# Patient Record
Sex: Female | Born: 1995 | Race: Black or African American | Hispanic: No | Marital: Single | Smoking: Former smoker
Health system: Southern US, Community
[De-identification: ages and names within clinical notes are randomized; demographics above are authoritative.]

## PROBLEM LIST (undated history)

## (undated) DIAGNOSIS — D689 Coagulation defect, unspecified: Secondary | ICD-10-CM

## (undated) HISTORY — PX: DILATION AND CURETTAGE OF UTERUS: SHX78

## (undated) HISTORY — DX: Coagulation defect, unspecified: D68.9

---
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
## 2020-06-04 HISTORY — PX: OTHER SURGICAL HISTORY: SHX169

## 2021-11-21 ENCOUNTER — Ambulatory Visit (INDEPENDENT_AMBULATORY_CARE_PROVIDER_SITE_OTHER): Payer: Medicaid Other | Admitting: Family Medicine

## 2021-11-21 ENCOUNTER — Encounter: Payer: Self-pay | Admitting: Family Medicine

## 2021-11-21 VITALS — BP 124/66 | HR 72 | Temp 98.2°F | Ht 67.0 in | Wt 243.2 lb

## 2021-11-21 DIAGNOSIS — D682 Hereditary deficiency of other clotting factors: Secondary | ICD-10-CM

## 2021-11-21 DIAGNOSIS — D6861 Antiphospholipid syndrome: Secondary | ICD-10-CM | POA: Diagnosis not present

## 2021-11-21 DIAGNOSIS — Z0000 Encounter for general adult medical examination without abnormal findings: Secondary | ICD-10-CM | POA: Diagnosis not present

## 2021-11-21 NOTE — Progress Notes (Signed)
Phone [phone]   Subjective:   Patient is a 26 y.o. female presenting for annual physical.    Chief Complaint  Patient presents with   Establish Care    Need new pcp, moved from [place] Fasting 4 months pregnancy, has clotting disorder   New pt-annual.  Moved from [place]. Dad here. Pregnant 4 mo-Factor V and APLS.  [facility]ing ob tomorrow[facility].  Will need Lovenox.  No energy-had iron infusions w/last pregnancy.  Last seen 8/17.  Vomiting if eats.  Dau 24yo.    See problem oriented charting- ROS- ROS: Gen: no fever, chills  Skin: no rash, itching ENT: no ear pain, ear drainage, nasal congestion, rhinorrhea, sinus pressure, sore throat Eyes: no blurry vision, double vision Resp: no cough, wheeze,SOB CV: no CP, palpitations, LE edema,  GI:+vomiting.  Has seen bright red blood at times-about twice/wk.   GU: no dysuria, urgency, frequency, hematuria MSK: no joint pain, myalgias, back pain Neuro: + Gets mild HA freq.  Not severe.   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   Past Surgical History:  Procedure Laterality Date   arm surgery Left 06/2020   fx   DILATION AND CURETTAGE OF UTERUS      Family History  Problem Relation Age of Onset   Hyperlipidemia Father    Hypertension Father    Diabetes Father    Hypertension Maternal Grandmother    Hyperlipidemia Maternal Grandmother    Diabetes Maternal Grandmother    Cancer Maternal Grandmother    Kidney disease Paternal Grandmother    Hypertension Paternal Grandmother    Hyperlipidemia Paternal Grandmother    Cancer Paternal Grandmother     Medications- reviewed and updated Current Outpatient Medications  Medication Sig Dispense Refill   Prenatal Vit-Fe Fumarate-FA  (PRENATAL VITAMIN PO) Take 1 tablet by mouth daily.     No current facility-administered medications for this visit.    Allergies-reviewed and updated No Known Allergies  Social History   Social History Narrative   Not on file   Objective  Objective:  BP 124/66   Pulse 72   Temp 98.2 F (36.8 C) (Temporal)   Ht 5\' 7"  (1.702 m)   Wt 243 lb 4 oz (110.3 kg)   SpO2 99%   BMI 38.10 kg/m  Physical Exam  Gen: WDWN NAD HEENT: NCAT, conjunctiva not injected, sclera nonicteric TM WNL B, OP moist, no exudates  NECK:  supple, no thyromegaly, no nodes, no carotid bruits CARDIAC: RRR, S1S2+, no murmur. DP 2+B LUNGS: CTAB. No wheezes ABDOMEN:  BS+, soft, NTND, No HSM, no masses EXT:  no edema MSK: no gross abnormalities. MS 5/5 all 4 NEURO: A&O x3.  CN II-XII intact.  PSYCH: normal mood. Good eye contact      Assessment and Plan   Health Maintenance counseling: 1. Anticipatory guidance: Patient counseled regarding regular dental exams q6 months, eye exams,  avoiding smoking and second hand smoke, limiting alcohol to 1 beverage per day, no illicit drugs.   2. Risk factor reduction:  Advised patient of need for regular exercise and diet rich and fruits and vegetables to reduce risk of heart attack and stroke. Exercise- some.  Wt Readings from Last 3 Encounters:  11/21/21 243  lb 4 oz (110.3 kg)   3. Immunizations/screenings/ancillary studies Immunization History  Administered Date(s) Administered   HPV 9-valent 10/22/2009, 08/10/2010   Tdap 10/22/2009   Health Maintenance Due  Topic Date Due   HIV Screening  Never done   Hepatitis C Screening  Never done   PAP-Cervical Cytology Screening  Never done   PAP SMEAR-Modifier  Never done   TETANUS/TDAP  10/23/2019    4. Cervical cancer screening: 10/20/21 5. Skin cancer screening- advised regular sunscreen use. Denies worrisome, changing, or new skin lesions.  6. Birth control/STD check: pregnant 7. Smoking associated  screening: non smoker 8. Alcohol screening: none  Problem List Items Addressed This Visit       Hematopoietic and Hemostatic   Factor V deficiency ([place])   Other Visit Diagnoses     Wellness examination    -  Primary   Antiphospholipid syndrome ([place])          Wellness-anticipatory guidance.  Work on diet/exercise.  No labs-defer to OB.  F/u 1 yr  High risk pregnancy d/t clotting disorders-seeing ob tomorrow.    Recommended follow up: 50mReturn in about 1 year (around 11/22/2022) for annual. Future Appointments  Date Time Provider [place]  12/05/2022  8:00 AM Tawnya Crook, MD [facility]    Lab/Order associations:+ fasting   ICD-10-CM   1. Wellness examination  Z00.00     2. Factor V deficiency ([place])  D68.2     3. Antiphospholipid syndrome (HCC)  D68.61       No orders of the defined types were placed in this encounter.    Wellington [place], MD

## 2021-11-21 NOTE — Patient Instructions (Signed)
Welcome to [facility] at [place]! It was a pleasure meeting you today. ° °As discussed, Please schedule a 12 month follow up visit today. ° °PLEASE NOTE: ° °If you had any LAB tests please let us know if you have not heard back within a few days. You may see your results on MyChart before we have a chance to review them but we will give you a call once they are reviewed by us. If we ordered any REFERRALS today, please let us know if you have not heard from their office within the next week.  °Let us know through MyChart if you are needing REFILLS, or have your pharmacy send us the request. You can also use MyChart to communicate with me or any office staff. ° °Please try these tips to maintain a healthy lifestyle: ° °Eat most of your calories during the day when you are active. Eliminate processed foods including packaged sweets (pies, cakes, cookies), reduce intake of potatoes, white bread, white pasta, and white rice. Look for whole grain options, oat flour or almond flour. ° °Each meal should contain half fruits/vegetables, one quarter protein, and one quarter carbs (no bigger than a computer mouse). ° °Cut down on sweet beverages. This includes juice, soda, and sweet tea. Also watch fruit intake, though this is a healthier sweet option, it still contains natural sugar! Limit to 3 servings daily. ° °Drink at least 1 glass of water with each meal and aim for at least 8 glasses per day ° °Exercise at least 150 minutes every week.   °

## 2021-11-29 ENCOUNTER — Other Ambulatory Visit: Payer: Self-pay

## 2021-11-29 ENCOUNTER — Other Ambulatory Visit: Payer: Self-pay | Admitting: Obstetrics and Gynecology

## 2021-11-29 DIAGNOSIS — Z363 Encounter for antenatal screening for malformations: Secondary | ICD-10-CM

## 2021-12-23 ENCOUNTER — Encounter: Payer: Self-pay | Admitting: *Deleted

## 2021-12-27 ENCOUNTER — Other Ambulatory Visit: Payer: Self-pay

## 2021-12-27 ENCOUNTER — Ambulatory Visit: Payer: Medicaid Other

## 2021-12-27 ENCOUNTER — Ambulatory Visit (HOSPITAL_BASED_OUTPATIENT_CLINIC_OR_DEPARTMENT_OTHER): Payer: Medicaid Other | Admitting: Obstetrics and Gynecology

## 2021-12-27 ENCOUNTER — Ambulatory Visit: Payer: Medicaid Other | Attending: Obstetrics and Gynecology

## 2021-12-27 VITALS — BP 121/70 | HR 91

## 2021-12-27 DIAGNOSIS — D6851 Activated protein C resistance: Secondary | ICD-10-CM

## 2021-12-27 DIAGNOSIS — O99119 Other diseases of the blood and blood-forming organs and certain disorders involving the immune mechanism complicating pregnancy, unspecified trimester: Secondary | ICD-10-CM | POA: Insufficient documentation

## 2021-12-27 DIAGNOSIS — O99112 Other diseases of the blood and blood-forming organs and certain disorders involving the immune mechanism complicating pregnancy, second trimester: Secondary | ICD-10-CM

## 2021-12-27 DIAGNOSIS — D6861 Antiphospholipid syndrome: Secondary | ICD-10-CM

## 2021-12-27 DIAGNOSIS — O99212 Obesity complicating pregnancy, second trimester: Secondary | ICD-10-CM

## 2021-12-27 DIAGNOSIS — E668 Other obesity: Secondary | ICD-10-CM | POA: Diagnosis present

## 2021-12-27 DIAGNOSIS — O2622 Pregnancy care for patient with recurrent pregnancy loss, second trimester: Secondary | ICD-10-CM

## 2021-12-27 DIAGNOSIS — Z3A19 19 weeks gestation of pregnancy: Secondary | ICD-10-CM | POA: Diagnosis present

## 2021-12-27 DIAGNOSIS — E669 Obesity, unspecified: Secondary | ICD-10-CM

## 2021-12-27 DIAGNOSIS — Z363 Encounter for antenatal screening for malformations: Secondary | ICD-10-CM | POA: Diagnosis present

## 2021-12-27 DIAGNOSIS — Z362 Encounter for other antenatal screening follow-up: Secondary | ICD-10-CM

## 2021-12-27 NOTE — Progress Notes (Signed)
Maternal-Fetal Medicine   Name: Isabel Green DOB: 13-Oct-1995 MRN: [id number] Referring Provider: Frann Rider, MD  I had the pleasure of seeing Ms. Hillock [facility]. She is G6 P1041 at 19w 6d gestation and is here for fetal anatomy scan and consultation. Past medical history significant for antiphospholipid syndrome diagnosed in her previous pregnancy.  Patient recently moved from [place] where she initiated her prenatal care.  Obstetrical history is significant for 4 early spontaneous miscarriages between 2017 and 2021.  She had D&C procedure performed after 1 miscarriage, but no fetal karyotype analysis was performed.  She had a term vaginal delivery in August 2022 of a female infant weighing 5 pounds and 9 ounces at birth.  In that pregnancy, antiphospholipid antibodies were detected, and patient was taking low molecular weight prophylaxis with successful pregnancy outcome.  No history of preeclampsia or diagnosed of fetal growth restriction in her previous pregnancy She had induction of labor at [redacted] weeks gestation.   Patient is unsure whether antiphospholipid antibodies were repeated to confirm the diagnosis.  She also has factor V Leiden heterozygous mutation. She does not give personal history of thromboembolism. She does not have hypertension or diabetes or any other chronic medical conditions.  She reports she does not have sickle cell trait.  Past surgical history: Nil of note. Medications: Prenatal vitamins, Lovenox 40 mg daily.  Patient was advised to take low-dose aspirin but has not yet started taking aspirin. Allergies: No known drug allergies. Social history: Denies tobacco or drug or alcohol use.  Her partner is African-American and he is in good health. Family history: No history of venous thromboembolism in the family. GYN history: No history of abnormal Pap smears or cervical surgeries.  No history of breast  biopsies.  Ultrasound We performed a fetal anatomical survey.  Fetal biometry is consistent with the previously established dates.  Amniotic fluid is normal and good fetal activity seen.  No markers of aneuploidies or fetal structural defects are seen.  Fetal anatomical survey could not be completed because of fetal position. Patient understands the limitations of ultrasound in detecting fetal anomalies.  Our concerns include Antiphospholipid syndrome This diagnosis was made in her previous pregnancy following detection of antiphospholipid (aPL) antibodies.  The test was ordered because of her history of recurrent miscarriages.  We do not have copies of the results with [place] now.  Patient has been taking Lovenox prophylaxis in this pregnancy.  She does not have personal history of venous thromboembolism or any other pregnancy complications including preeclampsia or indicated preterm delivery.  APS is an autoimmune disorder and the diagnosis should meet the clinical and laboratory criteria.  History of 3 or more spontaneous miscarriages meets the clinical criteria.  APL antibody should be positive on at least 2 occasions 12 weeks apart.  APS is associated with venous or arterial thrombosis.  Adverse pregnancy outcomes include preeclampsia, preterm delivery, stillbirth, and fetal growth restriction.  However, the association with adverse outcomes is not consistent.  Current recommendation is to treat APS with prophylactic heparin and low-dose aspirin.  Treatment does not always reduce the likelihood of poor pregnancy outcomes.  Because of a successful previous pregnancy outcome, patient would like to continue low molecular weight heparin prophylaxis.  Factor V Leiden heterozygous mutation increases the likelihood of venous thromboembolism in pregnancy but the absolute risk is very small.  Pregnancy increases the risk of venous thromboembolism.  In the absence of antiphospholipid syndrome, factor V  Leiden heterozygous  mutation is not a strong indication for heparin prophylaxis. I counseled the patient of low-dose aspirin prophylaxis to prevent or reduce the likelihood of developing gestational hypertension/preeclampsia.  Patient agreed to start taking low-dose aspirin.  I counseled the patient on possible side effects of heparin including rashes, thrombocytopenia and osteoporosis (rare).  Recommendations -Aspirin 81 mg daily from now till delivery. -Continue low molecular weight heparin prophylaxis till 36 or [redacted] weeks gestation and then switched to unfractionated heparin 10,000 units twice daily. -Calcium supplements. -An appointment was made for her to return in 5 weeks for completion of fetal anatomy. -Serial fetal growth assessments till delivery. -Weekly BPP from [redacted] weeks gestation till delivery. -Recommend delivery at [redacted] weeks gestation provided antenatal testing remains reassuring. -Postpartum anticoagulation till 6 weeks after delivery.   Thank you for consultation.  If you have any questions or concerns, please contact me the [facility].  Consultation including face-to-face (more than 50%) counseling 45 minutes.

## 2022-01-31 ENCOUNTER — Other Ambulatory Visit: Payer: Self-pay | Admitting: *Deleted

## 2022-01-31 ENCOUNTER — Ambulatory Visit: Payer: Self-pay

## 2022-01-31 ENCOUNTER — Ambulatory Visit: Payer: Medicaid Other

## 2022-01-31 ENCOUNTER — Ambulatory Visit: Payer: Medicaid Other | Attending: Obstetrics and Gynecology

## 2022-01-31 ENCOUNTER — Other Ambulatory Visit: Payer: Self-pay

## 2022-01-31 VITALS — BP 122/72 | HR 86

## 2022-01-31 DIAGNOSIS — Z3A24 24 weeks gestation of pregnancy: Secondary | ICD-10-CM | POA: Diagnosis not present

## 2022-01-31 DIAGNOSIS — Z362 Encounter for other antenatal screening follow-up: Secondary | ICD-10-CM

## 2022-01-31 DIAGNOSIS — D6851 Activated protein C resistance: Secondary | ICD-10-CM

## 2022-01-31 DIAGNOSIS — O99112 Other diseases of the blood and blood-forming organs and certain disorders involving the immune mechanism complicating pregnancy, second trimester: Secondary | ICD-10-CM | POA: Diagnosis not present

## 2022-01-31 DIAGNOSIS — O99119 Other diseases of the blood and blood-forming organs and certain disorders involving the immune mechanism complicating pregnancy, unspecified trimester: Secondary | ICD-10-CM

## 2022-01-31 DIAGNOSIS — E669 Obesity, unspecified: Secondary | ICD-10-CM

## 2022-01-31 DIAGNOSIS — D6861 Antiphospholipid syndrome: Secondary | ICD-10-CM | POA: Diagnosis not present

## 2022-01-31 DIAGNOSIS — O99212 Obesity complicating pregnancy, second trimester: Secondary | ICD-10-CM | POA: Diagnosis not present

## 2022-01-31 DIAGNOSIS — E668 Other obesity: Secondary | ICD-10-CM

## 2022-02-15 ENCOUNTER — Other Ambulatory Visit: Payer: Self-pay

## 2022-03-02 ENCOUNTER — Ambulatory Visit: Payer: Medicaid Other | Admitting: *Deleted

## 2022-03-02 ENCOUNTER — Ambulatory Visit: Payer: Medicaid Other | Attending: Maternal & Fetal Medicine

## 2022-03-02 VITALS — BP 127/69 | HR 84

## 2022-03-02 DIAGNOSIS — O99213 Obesity complicating pregnancy, third trimester: Secondary | ICD-10-CM | POA: Diagnosis present

## 2022-03-02 DIAGNOSIS — D6861 Antiphospholipid syndrome: Secondary | ICD-10-CM | POA: Diagnosis present

## 2022-03-02 DIAGNOSIS — Z3A29 29 weeks gestation of pregnancy: Secondary | ICD-10-CM | POA: Insufficient documentation

## 2022-03-02 DIAGNOSIS — D6851 Activated protein C resistance: Secondary | ICD-10-CM | POA: Diagnosis present

## 2022-03-02 DIAGNOSIS — O99119 Other diseases of the blood and blood-forming organs and certain disorders involving the immune mechanism complicating pregnancy, unspecified trimester: Secondary | ICD-10-CM | POA: Diagnosis present

## 2022-03-02 DIAGNOSIS — Z362 Encounter for other antenatal screening follow-up: Secondary | ICD-10-CM | POA: Insufficient documentation

## 2022-03-02 DIAGNOSIS — E668 Other obesity: Secondary | ICD-10-CM | POA: Insufficient documentation

## 2022-03-02 DIAGNOSIS — O99212 Obesity complicating pregnancy, second trimester: Secondary | ICD-10-CM | POA: Insufficient documentation

## 2022-03-02 DIAGNOSIS — O99113 Other diseases of the blood and blood-forming organs and certain disorders involving the immune mechanism complicating pregnancy, third trimester: Secondary | ICD-10-CM

## 2022-03-02 DIAGNOSIS — E669 Obesity, unspecified: Secondary | ICD-10-CM

## 2022-03-19 ENCOUNTER — Other Ambulatory Visit: Payer: Self-pay

## 2022-03-19 ENCOUNTER — Inpatient Hospital Stay (HOSPITAL_COMMUNITY)
Admission: AD | Admit: 2022-03-19 | Discharge: 2022-03-19 | Disposition: A | Discharge status: Home / Self Care | Payer: Medicaid Other | Attending: Obstetrics and Gynecology | Admitting: Obstetrics and Gynecology

## 2022-03-19 ENCOUNTER — Encounter (HOSPITAL_COMMUNITY): Payer: Self-pay | Admitting: Obstetrics and Gynecology

## 2022-03-19 DIAGNOSIS — Z3A31 31 weeks gestation of pregnancy: Secondary | ICD-10-CM | POA: Insufficient documentation

## 2022-03-19 DIAGNOSIS — O26893 Other specified pregnancy related conditions, third trimester: Secondary | ICD-10-CM | POA: Insufficient documentation

## 2022-03-19 DIAGNOSIS — O4703 False labor before 37 completed weeks of gestation, third trimester: Secondary | ICD-10-CM

## 2022-03-19 DIAGNOSIS — O4700 False labor before 37 completed weeks of gestation, unspecified trimester: Secondary | ICD-10-CM

## 2022-03-19 DIAGNOSIS — M549 Dorsalgia, unspecified: Secondary | ICD-10-CM | POA: Diagnosis not present

## 2022-03-19 DIAGNOSIS — O99891 Other specified diseases and conditions complicating pregnancy: Secondary | ICD-10-CM

## 2022-03-19 LAB — URINALYSIS, ROUTINE W REFLEX MICROSCOPIC
Bilirubin Urine: NEGATIVE
Glucose, UA: NEGATIVE mg/dL
Hgb urine dipstick: NEGATIVE
Ketones, ur: NEGATIVE mg/dL
Nitrite: NEGATIVE
Protein, ur: NEGATIVE mg/dL
Specific Gravity, Urine: 1.012 (ref 1.005–1.030)
pH: 7 (ref 5.0–8.0)

## 2022-03-19 MED ORDER — NIFEDIPINE 10 MG PO CAPS
10.0000 mg | ORAL_CAPSULE | ORAL | Status: DC | PRN
  Administered 2022-03-19 (×2): 10 mg via ORAL
  Filled 2022-03-19 (×2): qty 1

## 2022-03-19 MED ORDER — NIFEDIPINE 10 MG PO CAPS: 10.0000 mg | ORAL_CAPSULE | Freq: Four times a day (QID) | ORAL | 0 refills | Status: AC | PRN

## 2022-03-19 NOTE — MAU Note (Signed)
.  Isabel Green is a 27 y.o. at [redacted]w[redacted]d here in MAU reporting: ongoing lower abd cramping since Monday.  Pt reports she informed ob provider at her last appointment earlier this week, reports cramping has gotten worse this morning.  Denies LOF or vag bleeding.    Pain score: 7/10 Vitals:   03/19/22 1046  Pulse: 85  Resp: 17  Temp: 98 F (36.7 C)  SpO2: 98%     FHT: 145 Lab orders placed from triage:   ua

## 2022-03-19 NOTE — MAU Provider Note (Signed)
Chief Complaint:  Abdominal Pain   Event Date/Time   First Provider Initiated Contact with Patient 03/19/22 1132      HPI: Isabel Green is a 27 y.o. [id number] at [redacted]w[redacted]d who presents to maternity admissions reporting episodes of cramping that last a few hours, usually starting when the patient is standing at work. Last night, 03/18/22, she had more cramping than usual and in continued during the night, waking her up occasionally. She reports at least 2-3 contractions per hour last night.  Today, the contractions are still present but are improved from last night. She reports pelvic pressure, but no urinary symptoms.  She reports good fetal movement.    HPI  Past Medical History: Past Medical History:  Diagnosis Date   Clotting disorder ([place])    factor V and APLS    Past obstetric history: OB History  Gravida Para Term Preterm AB Living  6 1 1  0 4 1  SAB IAB Ectopic Multiple Live Births  4 0 0   1    # Outcome Date GA Lbr Len/2nd Weight Sex Delivery Anes PTL Lv  6 Current           5 Term 10/28/20    F Vag-Spont   LIV  4 SAB           3 SAB           2 SAB           1 SAB             Past Surgical History: Past Surgical History:  Procedure Laterality Date   arm surgery Left 06/2020   fx   DILATION AND CURETTAGE OF UTERUS      Family History: Family History  Problem Relation Age of Onset   Healthy Mother    Hyperlipidemia Father    Hypertension Father    Diabetes Father    Hypertension Maternal Grandmother    Hyperlipidemia Maternal Grandmother    Diabetes Maternal Grandmother    Cancer Maternal Grandmother    Kidney disease Paternal Grandmother    Hypertension Paternal Grandmother    Hyperlipidemia Paternal Grandmother    Cancer Paternal Grandmother    Asthma Neg Hx    Heart disease Neg Hx    Stroke Neg Hx     Social History: Social History   Tobacco Use   Smoking status: Former    Types: Cigars   Smokeless tobacco: Never  Scientific laboratory technician  Use: Never used  Substance Use Topics   Alcohol use: Not Currently    Alcohol/week: 1.0 standard drink of alcohol    Types: 1 Glasses of wine per week    Comment: used in the past   Drug use: Not Currently    Types: Marijuana    Comment: used in the past    Allergies: No Known Allergies  Meds:  Medications Prior to Admission  Medication Sig Dispense Refill Last Dose   aspirin 81 MG chewable tablet Chew 81 mg by mouth daily.   03/18/2022   Prenatal Vit-Fe Fumarate-FA (PRENATAL VITAMIN PO) Take 1 tablet by mouth daily.   03/18/2022   enoxaparin (LOVENOX) 40 MG/0.4ML injection Inject 40 mg into the skin daily.   03/17/2022    ROS:  Review of Systems  Constitutional:  Negative for chills, fatigue and fever.  Eyes:  Negative for visual disturbance.  Respiratory:  Negative for shortness of breath.   Cardiovascular:  Negative for chest pain.  Gastrointestinal:  Positive for abdominal pain. Negative for nausea and vomiting.  Genitourinary:  Negative for difficulty urinating, dysuria, flank pain, pelvic pain, vaginal bleeding, vaginal discharge and vaginal pain.  Musculoskeletal:  Positive for back pain.  Neurological:  Negative for dizziness and headaches.  Psychiatric/Behavioral: Negative.       I have reviewed patient's Past Medical Hx, Surgical Hx, Family Hx, Social Hx, medications and allergies.   Physical Exam  Patient Vitals for the past 24 hrs:  BP Temp Temp src Pulse Resp SpO2 Height Weight  03/19/22 1115 125/65 -- -- 88 -- 98 % -- --  03/19/22 1053 129/79 -- -- -- -- -- -- --  03/19/22 1046 -- 98 F (36.7 C) Oral 85 17 98 % 5\' 7"  (1.702 m) 123.5 kg   Constitutional: Well-developed, well-nourished female in no acute distress.  Cardiovascular: normal rate Respiratory: normal effort GI: Abd soft, non-tender, gravid appropriate for gestational age.  MS: Extremities nontender, no edema, normal ROM Neurologic: Alert and oriented x 4.  GU: Neg CVAT.  PELVIC EXAM:    Dilation: Closed Effacement (%): Thick Exam by:: L. Leftwich-Kirby, CNM  FHT:  Baseline 140, moderate variability, accelerations present, no decelerations Contractions: none on toco or to palpation   Labs: Results for orders placed or performed during the hospital encounter of 03/19/22 (from the past 24 hour(s))  Urinalysis, Routine w reflex microscopic Urine, Clean Catch     Status: Abnormal   Collection Time: 03/19/22 10:51 AM  Result Value Ref Range   Color, Urine YELLOW YELLOW   APPearance HAZY (A) CLEAR   Specific Gravity, Urine 1.012 1.005 - 1.030   pH 7.0 5.0 - 8.0   Glucose, UA NEGATIVE NEGATIVE mg/dL   Hgb urine dipstick NEGATIVE NEGATIVE   Bilirubin Urine NEGATIVE NEGATIVE   Ketones, ur NEGATIVE NEGATIVE mg/dL   Protein, ur NEGATIVE NEGATIVE mg/dL   Nitrite NEGATIVE NEGATIVE   Leukocytes,Ua TRACE (A) NEGATIVE   RBC / HPF 0-5 0 - 5 RBC/hpf   WBC, UA 0-5 0 - 5 WBC/hpf   Bacteria, UA RARE (A) NONE SEEN   Squamous Epithelial / HPF 6-10 0 - 5 /HPF   Mucus PRESENT       Imaging:   MAU Course/MDM: Orders Placed This Encounter  Procedures   Urinalysis, Routine w reflex microscopic Urine, Clean Catch    Meds ordered this encounter  Medications   NIFEdipine (PROCARDIA) capsule 10 mg     NST reviewed and appropriate for gestational age Cervix closed and thick, no evidence of PTL Procardia 10 mg x 2 doses given with resolution of contractions Rx for Procardia 10 mg Q 6 hours PRN at home Reviewed s/sx of PTL, reasons to seek care Pt to wear pregnancy support belt, drink plenty of water, and f/u in office if she needs to change work hours or have work restrictions  Pt discharge with strict return precautions.    Assessment:  1. Preterm uterine contractions   2. Back pain affecting pregnancy in third trimester   3. [redacted] weeks gestation of pregnancy     Plan: Discharge home Labor precautions and fetal kick counts  Allergies as of 03/19/2022   No Known  Allergies      Medication List     TAKE these medications    aspirin 81 MG chewable tablet Chew 81 mg by mouth daily.   enoxaparin 40 MG/0.4ML injection Commonly known as: LOVENOX Inject 40 mg into the skin daily.   NIFEdipine 10 MG capsule Commonly known  as: PROCARDIA Take 1 capsule (10 mg total) by mouth every 6 (six) hours as needed (contractions).   PRENATAL VITAMIN PO Take 1 tablet by mouth daily.        Fatima Blank Certified Nurse-Midwife 03/19/2022 11:50 AM

## 2022-03-28 ENCOUNTER — Other Ambulatory Visit: Payer: Self-pay

## 2022-03-28 ENCOUNTER — Ambulatory Visit: Payer: Medicaid Other

## 2022-03-28 ENCOUNTER — Other Ambulatory Visit: Payer: Self-pay | Admitting: Maternal & Fetal Medicine

## 2022-03-28 ENCOUNTER — Ambulatory Visit: Payer: Medicaid Other | Admitting: *Deleted

## 2022-03-28 ENCOUNTER — Ambulatory Visit: Payer: Medicaid Other | Attending: Maternal & Fetal Medicine

## 2022-03-28 VITALS — BP 120/69 | HR 79

## 2022-03-28 DIAGNOSIS — O99113 Other diseases of the blood and blood-forming organs and certain disorders involving the immune mechanism complicating pregnancy, third trimester: Secondary | ICD-10-CM | POA: Diagnosis not present

## 2022-03-28 DIAGNOSIS — Z3A32 32 weeks gestation of pregnancy: Secondary | ICD-10-CM

## 2022-03-28 DIAGNOSIS — D6851 Activated protein C resistance: Secondary | ICD-10-CM

## 2022-03-28 DIAGNOSIS — O99212 Obesity complicating pregnancy, second trimester: Secondary | ICD-10-CM | POA: Diagnosis present

## 2022-03-28 DIAGNOSIS — D6861 Antiphospholipid syndrome: Secondary | ICD-10-CM

## 2022-03-28 DIAGNOSIS — E669 Obesity, unspecified: Secondary | ICD-10-CM | POA: Diagnosis not present

## 2022-03-28 DIAGNOSIS — O99213 Obesity complicating pregnancy, third trimester: Secondary | ICD-10-CM

## 2022-03-28 DIAGNOSIS — O99119 Other diseases of the blood and blood-forming organs and certain disorders involving the immune mechanism complicating pregnancy, unspecified trimester: Secondary | ICD-10-CM | POA: Diagnosis not present

## 2022-03-28 DIAGNOSIS — E668 Other obesity: Secondary | ICD-10-CM | POA: Insufficient documentation

## 2022-03-28 NOTE — Procedures (Cosign Needed)
MICHELLA DETJEN 30-Oct-1995 [redacted]w[redacted]d  Fetus A Non-Stress Test Interpretation for 03/28/22  Indication: Unsatisfactory BPP and Obesity  Fetal Heart Rate A Mode: External Baseline Rate (A): 135 bpm Variability: Moderate Accelerations: 15 x 15 Decelerations: None Multiple birth?: No  Uterine Activity Mode: Palpation, Toco Contraction Frequency (min): UI Contraction Quality: Mild  Interpretation (Fetal Testing) Nonstress Test Interpretation: Reactive Comments: Dr. Epimenio Sarin reviewed tracing.

## 2022-04-04 ENCOUNTER — Ambulatory Visit: Payer: Medicaid Other | Admitting: *Deleted

## 2022-04-04 ENCOUNTER — Encounter: Payer: Self-pay | Admitting: *Deleted

## 2022-04-04 ENCOUNTER — Ambulatory Visit: Payer: Medicaid Other | Attending: Maternal & Fetal Medicine

## 2022-04-04 DIAGNOSIS — D6851 Activated protein C resistance: Secondary | ICD-10-CM | POA: Insufficient documentation

## 2022-04-04 DIAGNOSIS — O99212 Obesity complicating pregnancy, second trimester: Secondary | ICD-10-CM

## 2022-04-04 DIAGNOSIS — E669 Obesity, unspecified: Secondary | ICD-10-CM

## 2022-04-04 DIAGNOSIS — Z3A33 33 weeks gestation of pregnancy: Secondary | ICD-10-CM | POA: Diagnosis not present

## 2022-04-04 DIAGNOSIS — O99119 Other diseases of the blood and blood-forming organs and certain disorders involving the immune mechanism complicating pregnancy, unspecified trimester: Secondary | ICD-10-CM | POA: Insufficient documentation

## 2022-04-04 DIAGNOSIS — O99213 Obesity complicating pregnancy, third trimester: Secondary | ICD-10-CM | POA: Insufficient documentation

## 2022-04-04 DIAGNOSIS — O99113 Other diseases of the blood and blood-forming organs and certain disorders involving the immune mechanism complicating pregnancy, third trimester: Secondary | ICD-10-CM

## 2022-04-04 DIAGNOSIS — D6861 Antiphospholipid syndrome: Secondary | ICD-10-CM | POA: Diagnosis not present

## 2022-04-04 DIAGNOSIS — Z7901 Long term (current) use of anticoagulants: Secondary | ICD-10-CM

## 2022-04-11 ENCOUNTER — Ambulatory Visit: Payer: No Typology Code available for payment source

## 2022-04-11 ENCOUNTER — Ambulatory Visit: Payer: No Typology Code available for payment source | Attending: Maternal & Fetal Medicine

## 2022-04-18 ENCOUNTER — Ambulatory Visit: Payer: No Typology Code available for payment source | Attending: Maternal & Fetal Medicine

## 2022-04-18 ENCOUNTER — Ambulatory Visit: Payer: No Typology Code available for payment source

## 2022-04-25 ENCOUNTER — Ambulatory Visit: Payer: Medicaid Other

## 2022-04-25 ENCOUNTER — Ambulatory Visit: Payer: Medicaid Other | Attending: Maternal & Fetal Medicine

## 2022-05-02 ENCOUNTER — Ambulatory Visit: Payer: Medicaid Other | Attending: Maternal & Fetal Medicine

## 2022-05-02 ENCOUNTER — Ambulatory Visit: Payer: Medicaid Other

## 2022-11-17 ENCOUNTER — Other Ambulatory Visit: Payer: Self-pay | Admitting: *Deleted

## 2022-12-04 NOTE — Progress Notes (Incomplete)
Phone [phone]   Subjective:   Patient is a 27 y.o. female presenting for annual physical.    No chief complaint on file.  Annual - ***  *** - ***  *** - ***  *** - ***  *** - ***  *** - *** See problem oriented charting- ROS- ROS: Gen: no fever, chills  Skin: no rash, itching ENT: no ear pain, ear drainage, nasal congestion, rhinorrhea, sinus pressure, sore throat Eyes: no blurry vision, double vision Resp: no cough, wheeze,SOB CV: no CP, palpitations, LE edema,  GI: no heartburn, n/v/d/c, abd pain GU: no dysuria, urgency, frequency, hematuria MSK: no joint pain, myalgias, back pain Neuro: no dizziness, headache, weakness, vertigo Psych: no depression, anxiety, insomnia, SI   The following were reviewed and entered/updated in epic: Past Medical History:  Diagnosis Date   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   Patient Active Problem List   Diagnosis Date Noted   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   Past Surgical History:  Procedure Laterality Date   arm surgery Left 06/2020   fx   DILATION AND CURETTAGE OF UTERUS      Family History  Problem Relation Age of Onset   Healthy Mother    Hyperlipidemia Father    Hypertension Father    Diabetes Father    Hypertension Maternal Grandmother    Hyperlipidemia Maternal Grandmother    Diabetes Maternal Grandmother    Cancer Maternal Grandmother    Kidney disease Paternal Grandmother    Hypertension Paternal Grandmother    Hyperlipidemia Paternal Grandmother    Cancer Paternal Grandmother    Asthma Neg Hx    Heart disease Neg Hx    Stroke Neg Hx     Medications- reviewed and updated Current Outpatient Medications  Medication Sig Dispense Refill   aspirin 81 MG chewable tablet Chew 81 mg by mouth daily.     enoxaparin (LOVENOX) 40 MG/0.4ML injection Inject 40 mg into the skin daily.     NIFEdipine (PROCARDIA) 10 MG capsule Take 1 capsule (10 mg total) by mouth every 6 (six) hours as needed  (contractions). 20 capsule 0   Prenatal Vit-Fe Fumarate-FA (PRENATAL VITAMIN PO) Take 1 tablet by mouth daily.     No current facility-administered medications for this visit.    Allergies-reviewed and updated No Known Allergies  Social History   Social History Narrative   ** Merged History Encounter **       Objective  Objective:  There were no vitals taken for this visit. Physical Exam  Gen: WDWN NAD HEENT: NCAT, conjunctiva not injected, sclera nonicteric TM WNL B, OP moist, no exudates  NECK:  supple, no thyromegaly, no nodes, no carotid bruits CARDIAC: RRR, S1S2+, no murmur. DP 2+B LUNGS: CTAB. No wheezes ABDOMEN:  BS+, soft, NTND, No HSM, no masses EXT:  no edema MSK: no gross abnormalities. MS 5/5 all 4 NEURO: A&O x3.  CN II-XII intact.  PSYCH: normal mood. Good eye contact      Assessment and Plan   Health Maintenance counseling: 1. Anticipatory guidance: Patient counseled regarding regular dental exams q6 months, eye exams,  avoiding smoking and second hand smoke, limiting alcohol to 1 beverage per day, no illicit drugs.   2. Risk factor reduction:  Advised patient of need for regular exercise and diet rich and fruits and vegetables to reduce risk of heart attack and stroke. Exercise- ***.  Wt Readings from Last 3 Encounters:  03/19/22 272 lb 3.2 oz (  123.5 kg)  11/21/21 243 lb 4 oz (110.3 kg)   3. Immunizations/screenings/ancillary studies Immunization History  Administered Date(s) Administered   HPV 9-valent 10/22/2009, 08/10/2010   Tdap 10/22/2009   Health Maintenance Due  Topic Date Due   HIV Screening  Never done   Hepatitis C Screening  Never done   Cervical Cancer Screening (Pap smear)  Never done   DTaP/Tdap/Td (2 - Td or Tdap) 10/23/2019   INFLUENZA VACCINE  Never done   COVID-19 Vaccine (1 - 2023-24 season) Never done    4. Cervical cancer screening: Never done. 5. Skin cancer screening- advised regular sunscreen use. Denies worrisome,  changing, or new skin lesions.  6. Birth control/STD check: *** 7. Smoking associated screening: non-smoker 8. Alcohol screening: ***  There are no diagnoses linked to this encounter.  Recommended follow up: No follow-ups on file.  Lab/Order associations:*** fasting              I,Emily Lagle,acting as a scribe for Angelena Sole, MD.,have documented all relevant documentation on the behalf of Angelena Sole, MD,as directed by  Angelena Sole, MD while in the presence of Angelena Sole, MD.  *** (refresh reminder)  I, Angelena Sole, MD, have reviewed all documentation for this visit. The documentation on 12/04/22 for the exam, diagnosis, procedures, and orders are all accurate and complete.  Larey Brick

## 2022-12-05 ENCOUNTER — Encounter: Payer: Medicaid Other | Admitting: Family Medicine
